# Patient Record
Sex: Female | Born: 1975 | Race: White | Hispanic: No | Marital: Married | Smoking: Never smoker
Health system: Southern US, Community
[De-identification: ages and names within clinical notes are randomized; demographics above are authoritative.]

## PROBLEM LIST (undated history)

## (undated) DIAGNOSIS — K589 Irritable bowel syndrome without diarrhea: Secondary | ICD-10-CM

## (undated) DIAGNOSIS — F32A Depression, unspecified: Secondary | ICD-10-CM

## (undated) DIAGNOSIS — F329 Major depressive disorder, single episode, unspecified: Secondary | ICD-10-CM

## (undated) HISTORY — PX: CHOLECYSTECTOMY: SHX55

---

## 2002-08-04 ENCOUNTER — Encounter: Admission: RE | Admit: 2002-08-04 | Discharge: 2002-08-04 | Payer: Self-pay | Admitting: Infectious Diseases

## 2012-11-08 ENCOUNTER — Emergency Department (HOSPITAL_BASED_OUTPATIENT_CLINIC_OR_DEPARTMENT_OTHER)
Admission: EM | Admit: 2012-11-08 | Discharge: 2012-11-08 | Disposition: A | Discharge status: Home / Self Care | Payer: BC Managed Care – PPO | Attending: Emergency Medicine | Admitting: Emergency Medicine

## 2012-11-08 ENCOUNTER — Encounter (HOSPITAL_BASED_OUTPATIENT_CLINIC_OR_DEPARTMENT_OTHER): Payer: Self-pay | Admitting: *Deleted

## 2012-11-08 DIAGNOSIS — R1031 Right lower quadrant pain: Secondary | ICD-10-CM | POA: Insufficient documentation

## 2012-11-08 DIAGNOSIS — Z3202 Encounter for pregnancy test, result negative: Secondary | ICD-10-CM | POA: Insufficient documentation

## 2012-11-08 DIAGNOSIS — F3289 Other specified depressive episodes: Secondary | ICD-10-CM | POA: Insufficient documentation

## 2012-11-08 DIAGNOSIS — R109 Unspecified abdominal pain: Secondary | ICD-10-CM

## 2012-11-08 DIAGNOSIS — Z79899 Other long term (current) drug therapy: Secondary | ICD-10-CM | POA: Insufficient documentation

## 2012-11-08 DIAGNOSIS — F329 Major depressive disorder, single episode, unspecified: Secondary | ICD-10-CM | POA: Insufficient documentation

## 2012-11-08 DIAGNOSIS — R112 Nausea with vomiting, unspecified: Secondary | ICD-10-CM | POA: Insufficient documentation

## 2012-11-08 DIAGNOSIS — Z8719 Personal history of other diseases of the digestive system: Secondary | ICD-10-CM | POA: Insufficient documentation

## 2012-11-08 HISTORY — DX: Irritable bowel syndrome, unspecified: K58.9

## 2012-11-08 HISTORY — DX: Major depressive disorder, single episode, unspecified: F32.9

## 2012-11-08 HISTORY — DX: Depression, unspecified: F32.A

## 2012-11-08 LAB — COMPREHENSIVE METABOLIC PANEL
ALT: 11 U/L (ref 0–35)
AST: 14 U/L (ref 0–37)
Albumin: 4.5 g/dL (ref 3.5–5.2)
Alkaline Phosphatase: 71 U/L (ref 39–117)
BUN: 12 mg/dL (ref 6–23)
Chloride: 99 mEq/L (ref 96–112)
Potassium: 3.7 mEq/L (ref 3.5–5.1)
Sodium: 136 mEq/L (ref 135–145)
Total Bilirubin: 0.3 mg/dL (ref 0.3–1.2)
Total Protein: 8.4 g/dL — ABNORMAL HIGH (ref 6.0–8.3)

## 2012-11-08 LAB — CBC WITH DIFFERENTIAL/PLATELET
Basophils Relative: 0 % (ref 0–1)
Eosinophils Absolute: 0.1 10*3/uL (ref 0.0–0.7)
Hemoglobin: 14.7 g/dL (ref 12.0–15.0)
MCHC: 34.8 g/dL (ref 30.0–36.0)
Monocytes Relative: 5 % (ref 3–12)
Neutro Abs: 9.6 10*3/uL — ABNORMAL HIGH (ref 1.7–7.7)
Neutrophils Relative %: 80 % — ABNORMAL HIGH (ref 43–77)
Platelets: 249 10*3/uL (ref 150–400)
RBC: 4.89 MIL/uL (ref 3.87–5.11)

## 2012-11-08 LAB — URINALYSIS, ROUTINE W REFLEX MICROSCOPIC
Glucose, UA: NEGATIVE mg/dL
Leukocytes, UA: NEGATIVE
Nitrite: NEGATIVE
Protein, ur: NEGATIVE mg/dL
pH: 8.5 — ABNORMAL HIGH (ref 5.0–8.0)

## 2012-11-08 LAB — LIPASE, BLOOD: Lipase: 16 U/L (ref 11–59)

## 2012-11-08 LAB — PREGNANCY, URINE: Preg Test, Ur: NEGATIVE

## 2012-11-08 MED ORDER — ONDANSETRON HCL 4 MG/2ML IJ SOLN
4.0000 mg | Freq: Once | INTRAMUSCULAR | Status: AC
  Administered 2012-11-08: 4 mg via INTRAVENOUS
  Filled 2012-11-08: qty 2

## 2012-11-08 MED ORDER — HYDROMORPHONE HCL PF 1 MG/ML IJ SOLN
1.0000 mg | Freq: Once | INTRAMUSCULAR | Status: AC
Start: 2012-11-08 — End: 2012-11-08
  Administered 2012-11-08: 1 mg via INTRAVENOUS
  Filled 2012-11-08: qty 1

## 2012-11-08 MED ORDER — PROMETHAZINE HCL 25 MG PO TABS: 25.0000 mg | ORAL_TABLET | Freq: Four times a day (QID) | ORAL | Status: DC | PRN

## 2012-11-08 MED ORDER — OXYCODONE-ACETAMINOPHEN 5-325 MG PO TABS: 1.0000 | ORAL_TABLET | Freq: Four times a day (QID) | ORAL | Status: DC | PRN

## 2012-11-08 NOTE — ED Notes (Signed)
Right lower quadrant abdominal pain. Has been treated for ovarian cyst this week. Yesterday had blood work and was started on Doxycycline. Vomiting this am. Her MD advised she be seen at the ED.

## 2012-11-08 NOTE — ED Notes (Signed)
Patient is resting comfortably. 

## 2012-11-08 NOTE — ED Notes (Signed)
Vital signs stable. 

## 2012-11-08 NOTE — ED Notes (Signed)
Family at bedside. 

## 2012-11-08 NOTE — ED Provider Notes (Signed)
History     CSN: [id number]  Arrival date & time 11/08/12  1145   First MD Initiated Contact with Patient 11/08/12 1526      Chief Complaint  Patient presents with  . Abdominal Pain    (Consider location/radiation/quality/duration/timing/severity/associated sxs/prior treatment) HPI Comments: Patient with history of ovarian cysts presents with a 1 week history of RLQ pain. She was seen at urgent 5 days ago where a CT showed a right ovarian cyst.  She followed up with her OB/GYN who confirmed the ovarian cyst without torsion or other complications using ultrasound.  Her LMP was 3 weeks ago and pregnancy tests have been negative. She denies vaginal discharge or bleeding.  Yesterday, the pain was not better so she saw her PCP who did a CBC which showed an elevated white count and he prescribed doxycycline for an intestinal infection.  She presents today with continued RLQ pain.  She vomited this morning and has been unable to eat since.  She denies fever, constipation, and diarrhea. Onset gradual. Course persistent. She has been taking Vicodin for pain that provides some relief.   Patient is a 37 y.o. female presenting with abdominal pain. The history is provided by the patient.  Abdominal Pain The primary symptoms of the illness include abdominal pain, nausea and vomiting. The primary symptoms of the illness do not include fever, diarrhea, dysuria, vaginal discharge or vaginal bleeding.  Symptoms associated with the illness do not include hematuria or frequency.    Past Medical History  Diagnosis Date  . IBS (irritable bowel syndrome)   . Depression     Past Surgical History  Procedure Date  . Cholecystectomy     No family history on file.  History  Substance Use Topics  . Smoking status: Never Smoker   . Smokeless tobacco: Not on file  . Alcohol Use: No    OB History    Grav Para Term Preterm Abortions TAB SAB Ect Mult Living                  Review of Systems    Constitutional: Negative for fever.  HENT: Negative for sore throat and rhinorrhea.   Eyes: Negative for redness.  Respiratory: Negative for cough.   Cardiovascular: Negative for chest pain.  Gastrointestinal: Positive for nausea, vomiting and abdominal pain. Negative for diarrhea.  Genitourinary: Negative for dysuria, frequency, hematuria, vaginal bleeding, vaginal discharge and pelvic pain.  Musculoskeletal: Negative for myalgias.  Skin: Negative for rash.  Neurological: Negative for headaches.    Allergies  Compazine; Sulfa antibiotics; and Penicillins  Home Medications   Current Outpatient Rx  Name  Route  Sig  Dispense  Refill  . SERTRALINE HCL 100 MG PO TABS   Oral   Take 100 mg by mouth daily.           BP 134/108  Pulse 96  Temp 98.4 F (36.9 C) (Oral)  Resp 20  SpO2 100%  Physical Exam  Nursing note and vitals reviewed. Constitutional: She appears well-developed and well-nourished.  HENT:  Head: Normocephalic and atraumatic.  Eyes: Conjunctivae normal are normal. Right eye exhibits no discharge. Left eye exhibits no discharge.  Neck: Normal range of motion. Neck supple.  Cardiovascular: Normal rate, regular rhythm and normal heart sounds.   Pulmonary/Chest: Effort normal and breath sounds normal.  Abdominal: Soft. Bowel sounds are normal. There is tenderness (mild) in the right lower quadrant. There is no rigidity, no rebound, no guarding, no tenderness at McBurney's point  and negative Murphy's sign.         Neg psoas and neg rovsings  Neurological: She is alert.  Skin: Skin is warm and dry.  Psychiatric: She has a normal mood and affect.    ED Course  Procedures (including critical care time)  Labs Reviewed  URINALYSIS, ROUTINE W REFLEX MICROSCOPIC - Abnormal; Notable for the following:    APPearance CLOUDY (*)     pH 8.5 (*)     All other components within normal limits  CBC WITH DIFFERENTIAL - Abnormal; Notable for the following:    WBC 12.0  (*)     Neutrophils Relative 80 (*)     Neutro Abs 9.6 (*)     All other components within normal limits  COMPREHENSIVE METABOLIC PANEL - Abnormal; Notable for the following:    Total Protein 8.4 (*)     All other components within normal limits  PREGNANCY, URINE  LIPASE, BLOOD   No results found.   1. Abdominal pain     Patient seen and examined. Work-up initiated. Medications ordered.   Vital signs reviewed and are as follows: Filed Vitals:   11/08/12 1759  BP: 131/82  Pulse: 71  Temp:   Resp: 15   CT report obtained from 11/03/12. Pt had 3.7cm benign R ovarian cyst. No other significant findings.   Results reviewed with Dr. Blinda Leatherwood and patient.   At this point we will continue symptomatic control -- given unchanged symptoms.   The patient was urged to return to the Emergency Department immediately with worsening of current symptoms, worsening abdominal pain, persistent vomiting, blood noted in stools, fever, or any other concerns. The patient verbalized understanding.   Patient counseled on use of narcotic pain medications. Counseled not to combine these medications with others containing tylenol. Urged not to drink alcohol, drive, or perform any other activities that requires focus while taking these medications. The patient verbalizes understanding and agrees with the plan.     MDM  Patient with right lower quadrant abdominal pain for the past week. She has had persistent pain that has not changed. She has not had fever. She had CT as well as ultrasound which demonstrated 3.7 cm ovarian cyst. She has mild leukocytosis. Nausea improved and tolerating POs in ED. Exam with minimal tenderness. No peritoneal signs. Do not feel patient warrants an additional CT scan tonight. She is reliable and is okay with watchful waiting. Given duration of symptoms as well as exam here feel that appendicitis is very low probability. Given exam, feel that right ovarian cyst is likely the  etiology of her symptoms. Do not suspect colitis or diverticulitis. Do not suspect ovarian torsion, TOA, PID. Patient appears well and non-toxic.         Renne Crigler, [place] 11/08/12 [phone]

## 2012-11-08 NOTE — ED Provider Notes (Signed)
Medical screening examination/treatment/procedure(s) were performed by non-physician practitioner and as supervising physician I was immediately available for consultation/collaboration.  Gilda Crease, MD 11/08/12 [phone]

## 2017-03-08 ENCOUNTER — Encounter: Payer: Self-pay | Admitting: Sports Medicine

## 2017-03-08 ENCOUNTER — Ambulatory Visit (INDEPENDENT_AMBULATORY_CARE_PROVIDER_SITE_OTHER): Payer: 59 | Admitting: Sports Medicine

## 2017-03-08 DIAGNOSIS — M25572 Pain in left ankle and joints of left foot: Secondary | ICD-10-CM

## 2017-03-08 DIAGNOSIS — M84375D Stress fracture, left foot, subsequent encounter for fracture with routine healing: Secondary | ICD-10-CM | POA: Diagnosis not present

## 2017-03-08 DIAGNOSIS — M84375A Stress fracture, left foot, initial encounter for fracture: Secondary | ICD-10-CM | POA: Insufficient documentation

## 2017-03-08 NOTE — Progress Notes (Signed)
Left foot pain  8 weeks ago dx w left 4th MT stress fx Occurred after "boot camp" Put in boot and pain steadily resolved Saw orthopedist 2 weeks ago and had XR Advised not completely healed    Past 2 weeks has walked and tried some running  Still has some pain but no swelling Comes for opinion  ROS No numbness in feet No pain in RT foot  PE Pleasant F in NAD BP (!) 150/112   Ht 5\' 4"  (1.626 m)   Wt 200 lb (90.7 kg)   BMI 34.33 kg/m   Left Foot shows subluxation of left 5th MTP with lateral rotation Loss of transverse arch Splaying toes 1 and 2 No TTP over MT 4 No swelling   RT foot shows more normal transverse arch Long arch is moderately high  Gait is normal  Ultrasound of left Foot  MTP joint 4 is normal 4th MT shaft is normal on long and short axis Other MT shafts scanned and appear normal No soft tissue swelling  Impression: healed stress fracture based on normal scan today

## 2017-03-08 NOTE — Assessment & Plan Note (Signed)
Placed an arch strap MT pad for left foot  Both of these changes seemed to relieve pain Test for 1 week If working well add MT pads to other shoes  Gradually increase activity as tolerated

## 2019-08-25 ENCOUNTER — Other Ambulatory Visit: Payer: Self-pay

## 2019-08-25 ENCOUNTER — Ambulatory Visit
Admission: RE | Admit: 2019-08-25 | Discharge: 2019-08-25 | Disposition: A | Discharge status: Home / Self Care | Payer: 59 | Source: Ambulatory Visit | Attending: Family Medicine | Admitting: Family Medicine

## 2019-08-25 ENCOUNTER — Ambulatory Visit: Payer: 59 | Admitting: Family Medicine

## 2019-08-25 ENCOUNTER — Encounter: Payer: Self-pay | Admitting: Family Medicine

## 2019-08-25 VITALS — BP 122/90 | Ht 64.0 in | Wt 220.0 lb

## 2019-08-25 DIAGNOSIS — M25532 Pain in left wrist: Secondary | ICD-10-CM

## 2019-08-25 NOTE — Assessment & Plan Note (Addendum)
Left hand and wrist pain status post fall.  Focal tenderness over left metacarpal bone.  Ultrasound not show any gross fracture.  Sent for plain film evaluation of left hand to rule out fracture and wrist to rule out scaphoid fracture.  X-rays were negative for fracture.  Recommend patient wear brace for comfort, ice or NSAIDs for pain relief, follow-up in 3 weeks if not improved.

## 2019-08-25 NOTE — Patient Instructions (Signed)
Your x-rays look great. I'd expect this to take 2-3 weeks to resolve. Icing 15 minutes at a time 3-4 times a day as needed. Tylenol, aleve if needed for pain. You can use the brace or buddy taping if needed for comfort. Follow up with me in 3 weeks if not improving as expected.

## 2019-08-25 NOTE — Progress Notes (Addendum)
    Subjective:  Rachel Cohen is a 43 y.o. female who presents to the [facility] today with a chief complaint of left hand pain.   HPI:  Patient fell on left hand on Thursday after tripping over her pants.  Patient has had pain in her left hand since then dorsally on radial side.  Has been worsening.  Says the pain is 7 or 8 out of 10.  Says that it does radiate up her hand into her shoulder.  Does endorse some finger numbness.  She has been taking ibuprofen and icing the area as needed for relief.  She is also worn a wrist brace which has not provided any relief.  ROS: Per HPI  Objective:  Physical Exam: BP 122/90   Ht 5\' 4"  (1.626 m)   Wt 220 lb (99.8 kg)   BMI 37.76 kg/m   Gen: NAD, resting comfortably Pulm: NWOB  Left hand Inspection: Some edema and contusion over dorsal aspect of hand especially over 2nd metacarpal. Palpation: Tender to palpation over the dorsal aspect of the second metacarpal, minimal tenderness over snuffbox ROM: Full active range of motion of fingers  Strength: Weak grip strength due to pain Stability: Wrist appears stable Special tests: None Vascular studies: 2+ radial Skin: warm, dry Psych: Normal affect and thought content  Right hand: No deformity, instability. FROM with 5/5 strength. No tenderness to palpation. NVI distally.  POC ultrasound did not show any fracture of the second metacarpal, did show some minimal tenosynovitis of extensor carpi radialis brevis, remainder of exam is normal  Assessment/Plan:  Left wrist pain Left hand and wrist pain status post fall.  Focal tenderness over left second metacarpal bone.  Ultrasound did not show any gross fracture.  Sent for plain film evaluation of left hand to rule out fracture and wrist to rule out scaphoid fracture.  X-rays independently reviewed were negative for fracture.  Recommend patient wear brace for comfort, ice or NSAIDs for pain relief, follow-up in 3 weeks if not improved.    Marny Lowenstein, MD, MS FAMILY MEDICINE RESIDENT - PGY3 08/25/2019 4:07 PM

## 2019-08-26 ENCOUNTER — Encounter: Payer: Self-pay | Admitting: Family Medicine

## 2019-09-24 ENCOUNTER — Ambulatory Visit: Payer: 59 | Admitting: Family Medicine

## 2019-09-24 ENCOUNTER — Ambulatory Visit
Admission: RE | Admit: 2019-09-24 | Discharge: 2019-09-24 | Disposition: A | Discharge status: Home / Self Care | Payer: 59 | Source: Ambulatory Visit | Attending: Family Medicine | Admitting: Family Medicine

## 2019-09-24 ENCOUNTER — Other Ambulatory Visit: Payer: Self-pay

## 2019-09-24 VITALS — BP 134/92 | Ht 64.0 in | Wt 220.0 lb

## 2019-09-24 DIAGNOSIS — M79642 Pain in left hand: Secondary | ICD-10-CM | POA: Diagnosis not present

## 2019-09-24 NOTE — Patient Instructions (Signed)
Get x-rays after you leave today. We will call you with results and next steps (likely MRI if these are normal). Icing, tylenol, aleve as needed in meantime.

## 2019-09-25 ENCOUNTER — Encounter: Payer: Self-pay | Admitting: Family Medicine

## 2019-09-25 ENCOUNTER — Telehealth: Payer: Self-pay

## 2019-09-25 NOTE — Progress Notes (Signed)
PCP: Maylon Peppers, MD  Subjective:   HPI: Patient is a 43 y.o. female here for left hand pain.  11/2: Patient fell on left hand on Thursday after tripping over her pants.  Patient has had pain in her left hand since then dorsally on radial side.  Has been worsening.  Says the pain is 7 or 8 out of 10.  Says that it does radiate up her hand into her shoulder.  Does endorse some finger numbness.  She has been taking ibuprofen and icing the area as needed for relief.  She is also worn a wrist brace which has not provided any relief.  12/2: Patient reports her hand feels the same compared to last visit. She wore brace, buddy taped digits initially. Still getting swelling, discoloration, and pain localized dorsally about the 2nd metacarpal. No new injuries.  Past Medical History:  Diagnosis Date  . Depression   . IBS (irritable bowel syndrome)     Current Outpatient Medications on File Prior to Visit  Medication Sig Dispense Refill  . ALPRAZolam (XANAX) 0.25 MG tablet TK 1 T PO HS PRN    . hydrOXYzine (ATARAX/VISTARIL) 25 MG tablet TK 2 TS PO HS    . levonorgestrel-ethinyl estradiol (INTROVALE) 0.15-0.03 MG tablet Take by mouth.    . pantoprazole (PROTONIX) 40 MG tablet Take by mouth.    . sertraline (ZOLOFT) 100 MG tablet Take 100 mg by mouth daily.     No current facility-administered medications on file prior to visit.     Past Surgical History:  Procedure Laterality Date  . CHOLECYSTECTOMY      Allergies  Allergen Reactions  . Compazine [Prochlorperazine] Nausea Only  . Sulfa Antibiotics Nausea Only  . Penicillins Rash    Social History   Socioeconomic History  . Marital status: Married    Spouse name: Not on file  . Number of children: Not on file  . Years of education: Not on file  . Highest education level: Not on file  Occupational History  . Not on file  Social Needs  . Financial resource strain: Not on file  . Food insecurity    Worry: Not on file   Inability: Not on file  . Transportation needs    Medical: Not on file    Non-medical: Not on file  Tobacco Use  . Smoking status: Never Smoker  . Smokeless tobacco: Never Used  Substance and Sexual Activity  . Alcohol use: No  . Drug use: No  . Sexual activity: Not on file  Lifestyle  . Physical activity    Days per week: Not on file    Minutes per session: Not on file  . Stress: Not on file  Relationships  . Social Herbalist on phone: Not on file    Gets together: Not on file    Attends religious service: Not on file    Active member of club or organization: Not on file    Attends meetings of clubs or organizations: Not on file    Relationship status: Not on file  . Intimate partner violence    Fear of current or ex partner: Not on file    Emotionally abused: Not on file    Physically abused: Not on file    Forced sexual activity: Not on file  Other Topics Concern  . Not on file  Social History Narrative  . Not on file    History reviewed. No pertinent family history.  BP (!) 134/92   Ht 5\' 4"  (1.626 m)   Wt 220 lb (99.8 kg)   BMI 37.76 kg/m   Review of Systems: See HPI above.     Objective:  Physical Exam:  Gen: NAD, comfortable in exam room  Left hand/wrist: Mild swelling dorsal left hand worst over 2nd metacarpal.  No bruising, malrotation, angulation of digits. FROM with 5/5 strength digits including PIP, DIP flexion and extension 2nd digit. TTP greatest dorsal 2nd metacarpal.  Minimal tenderness 3rd metacarpal, radial sided carpal bones.   NVI distally.  Limited MSK u/s left hand:  No cortical irregularity noted of 2nd, 3rd metacarpals or scaphoid.  No edema overlying cortices.     Assessment & Plan:  1. Left hand injury - patient over a month out from her fall and continues to struggle with pain, swelling worst about 2nd metacarpal.  MSK u/s without abnormalities today and is reassuring.  Advised we go ahead with radiographs and depending  on results consider MRI to assess for occult fracture, partial extensor tendon tear.  Icing, tylenol, aleve in meantime.

## 2019-09-25 NOTE — Telephone Encounter (Signed)
Let pt know that her x-ray was negative. Dr. Barbaraann Barthel suggested proceeding with the MRI to rule out occult fracture. Pt wants to wait on the MRI because insurance won't cover much and they are expensive. She was asking if she could treat it conservatively as if there was a fracture and see if she gets better. Informed pt that Dr. Barbaraann Barthel will be back in the office tomorrow morning so her message would be passed along to him and we'd get back to her.

## 2019-09-26 ENCOUNTER — Other Ambulatory Visit: Payer: Self-pay

## 2019-09-26 ENCOUNTER — Ambulatory Visit (INDEPENDENT_AMBULATORY_CARE_PROVIDER_SITE_OTHER): Payer: 59 | Admitting: Family Medicine

## 2019-09-26 VITALS — BP 124/86

## 2019-09-26 DIAGNOSIS — M79642 Pain in left hand: Secondary | ICD-10-CM | POA: Diagnosis not present

## 2019-09-26 NOTE — Progress Notes (Signed)
Patient came in today to have radial gutter splint placed (see phone note).  Follow up in about 3 weeks.

## 2019-09-26 NOTE — Telephone Encounter (Signed)
We could - there are a couple options here.  One would be to put her in a radial gutter splint for 3 weeks to stabilize and rest this (we talked a little about this).    The more risky option would be to have her focus on regaining motion and strength in this hand - if she has an occult fracture it should be mostly healed by now and a lot of her pain will be due to stiffness and swelling (After 3 weeks of splinting we would do this if she chose the splint).

## 2019-10-20 ENCOUNTER — Other Ambulatory Visit: Payer: Self-pay

## 2019-10-20 ENCOUNTER — Ambulatory Visit: Payer: 59 | Admitting: Family Medicine

## 2019-10-20 ENCOUNTER — Encounter: Payer: Self-pay | Admitting: Family Medicine

## 2019-10-20 VITALS — BP 126/90

## 2019-10-20 DIAGNOSIS — M79642 Pain in left hand: Secondary | ICD-10-CM

## 2019-10-20 NOTE — Progress Notes (Signed)
PCP: Cheral Bay, MD  Subjective:   HPI: Patient is a 43 y.o. female here for left hand pain.  11/2: Patient fell on left hand on Thursday after tripping over her pants.  Patient has had pain in her left hand since then dorsally on radial side.  Has been worsening.  Says the pain is 7 or 8 out of 10.  Says that it does radiate up her hand into her shoulder.  Does endorse some finger numbness.  She has been taking ibuprofen and icing the area as needed for relief.  She is also worn a wrist brace which has not provided any relief.  12/2: Patient reports her hand feels the same compared to last visit. She wore brace, buddy taped digits initially. Still getting swelling, discoloration, and pain localized dorsally about the 2nd metacarpal. No new injuries.  12/28: Patient reports she feels much better  Has been wearing radial gutter splint without any issues. No pain currently. No skin changes, numbness.  Past Medical History:  Diagnosis Date  . Depression   . IBS (irritable bowel syndrome)     Current Outpatient Medications on File Prior to Visit  Medication Sig Dispense Refill  . albuterol (VENTOLIN HFA) 108 (90 Base) MCG/ACT inhaler Inhale into the lungs.    . beclomethasone (QVAR REDIHALER) 40 MCG/ACT inhaler Inhale into the lungs.    . doxepin (SINEQUAN) 10 MG capsule Take by mouth.    . ALPRAZolam (XANAX) 0.25 MG tablet TK 1 T PO HS PRN    . BuPROPion HBr 348 MG TB24 Take by mouth.    . hydrOXYzine (ATARAX/VISTARIL) 25 MG tablet TK 2 TS PO HS    . levonorgestrel-ethinyl estradiol (INTROVALE) 0.15-0.03 MG tablet Take by mouth.    . pantoprazole (PROTONIX) 40 MG tablet Take by mouth.    . sertraline (ZOLOFT) 100 MG tablet Take 100 mg by mouth daily.     No current facility-administered medications on file prior to visit.    Past Surgical History:  Procedure Laterality Date  . CHOLECYSTECTOMY      Allergies  Allergen Reactions  . Compazine [Prochlorperazine] Nausea  Only  . Sulfa Antibiotics Nausea Only  . Penicillins Rash    Social History   Socioeconomic History  . Marital status: Married    Spouse name: Not on file  . Number of children: Not on file  . Years of education: Not on file  . Highest education level: Not on file  Occupational History  . Not on file  Tobacco Use  . Smoking status: Never Smoker  . Smokeless tobacco: Never Used  Substance and Sexual Activity  . Alcohol use: No  . Drug use: No  . Sexual activity: Not on file  Other Topics Concern  . Not on file  Social History Narrative  . Not on file   Social Determinants of Health   Financial Resource Strain:   . Difficulty of Paying Living Expenses: Not on file  Food Insecurity:   . Worried About Programme researcher, broadcasting/film/video in the Last Year: Not on file  . Ran Out of Food in the Last Year: Not on file  Transportation Needs:   . Lack of Transportation (Medical): Not on file  . Lack of Transportation (Non-Medical): Not on file  Physical Activity:   . Days of Exercise per Week: Not on file  . Minutes of Exercise per Session: Not on file  Stress:   . Feeling of Stress : Not on file  Social Connections:   . Frequency of Communication with Friends and Family: Not on file  . Frequency of Social Gatherings with Friends and Family: Not on file  . Attends Religious Services: Not on file  . Active Member of Clubs or Organizations: Not on file  . Attends Archivist Meetings: Not on file  . Marital Status: Not on file  Intimate Partner Violence:   . Fear of Current or Ex-Partner: Not on file  . Emotionally Abused: Not on file  . Physically Abused: Not on file  . Sexually Abused: Not on file    History reviewed. No pertinent family history.  BP 126/90   Review of Systems: See HPI above.     Objective:  Physical Exam:  Gen: NAD, comfortable in exam room  Left hand/wrist: No deformity, swelling, bruising, instability. FROM with 5/5 strength digits and  wrist. No tenderness to palpation. NVI distally.     Assessment & Plan:  1. Left hand injury - consistent with metacarpal contusion vs healed occult metacarpal fracture.  Much improved today following gutter splint.  Discontinue this.  Icing, tylenol, aleve only if needed.  Motion exercises reviewed to work out stiffness.  Let us know if she doesn't continue to do well over next couple weeks.  Activities as tolerated.  Otherwise f/u prn.

## 2020-09-24 ENCOUNTER — Ambulatory Visit: Payer: 59 | Admitting: Family Medicine

## 2020-09-27 ENCOUNTER — Ambulatory Visit: Payer: 59 | Admitting: Family Medicine

## 2021-02-07 IMAGING — CR DG WRIST COMPLETE 3+V*L*
4 series · 4 of 4 positions shown · non-contrast
Comparison: None

CLINICAL DATA: Left hand and wrist pain

EXAM:
LEFT WRIST - COMPLETE 3+ VIEW

[x wrist pa left]
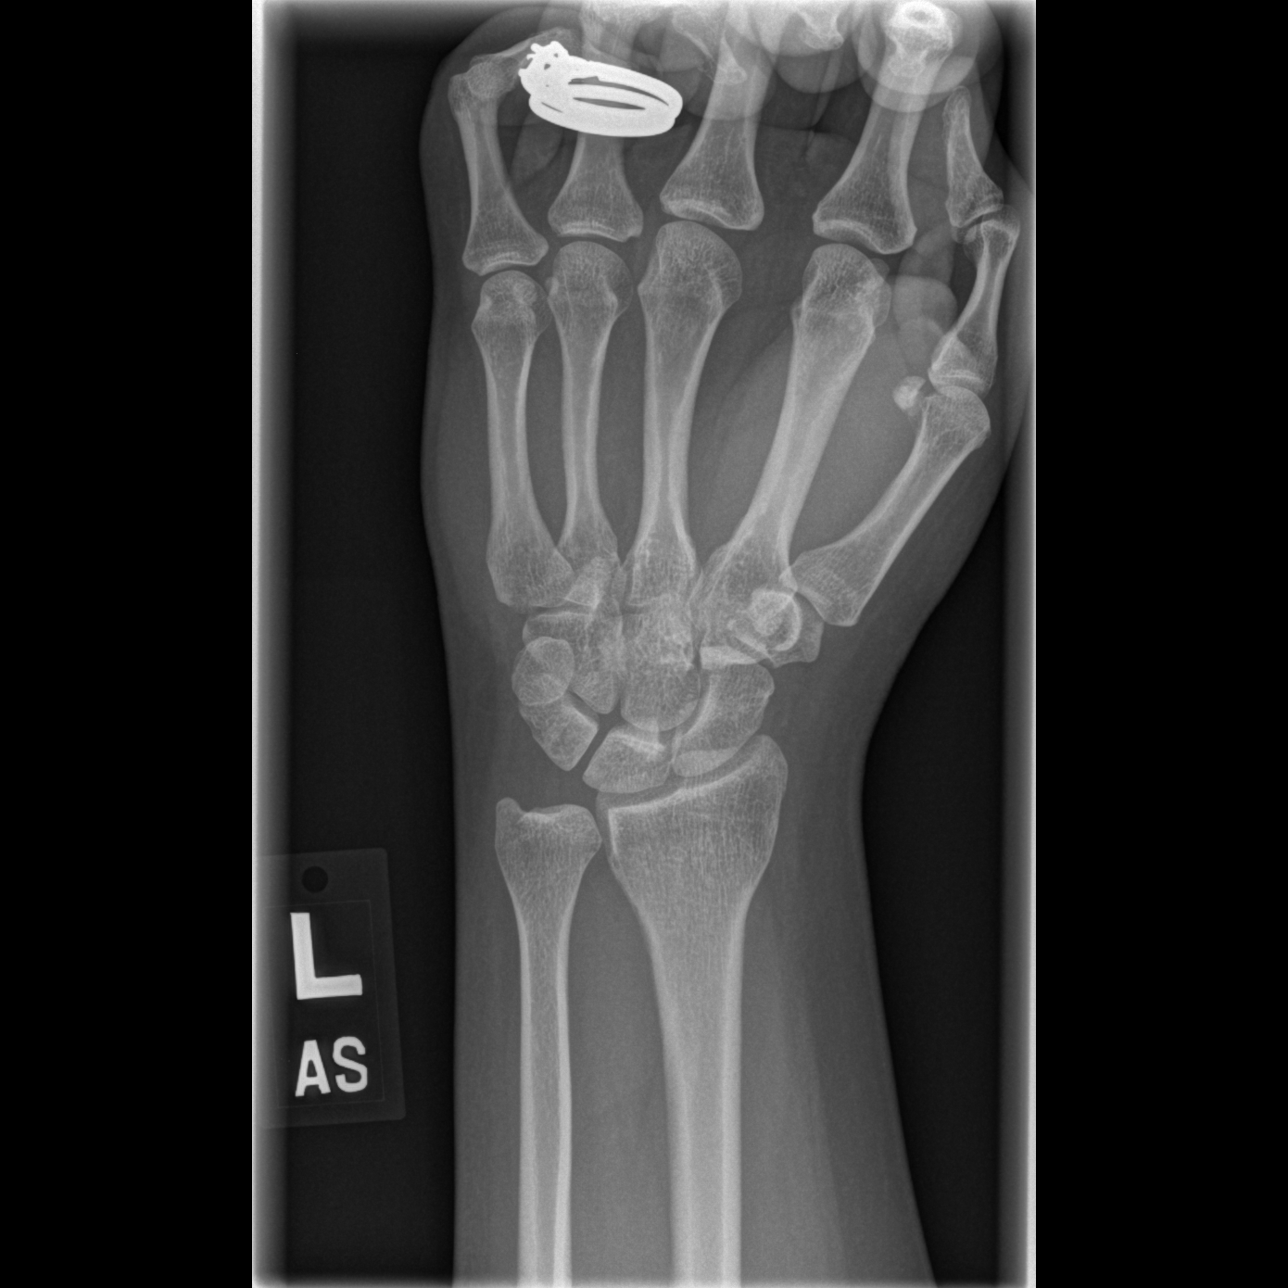

[x wrist obl left]
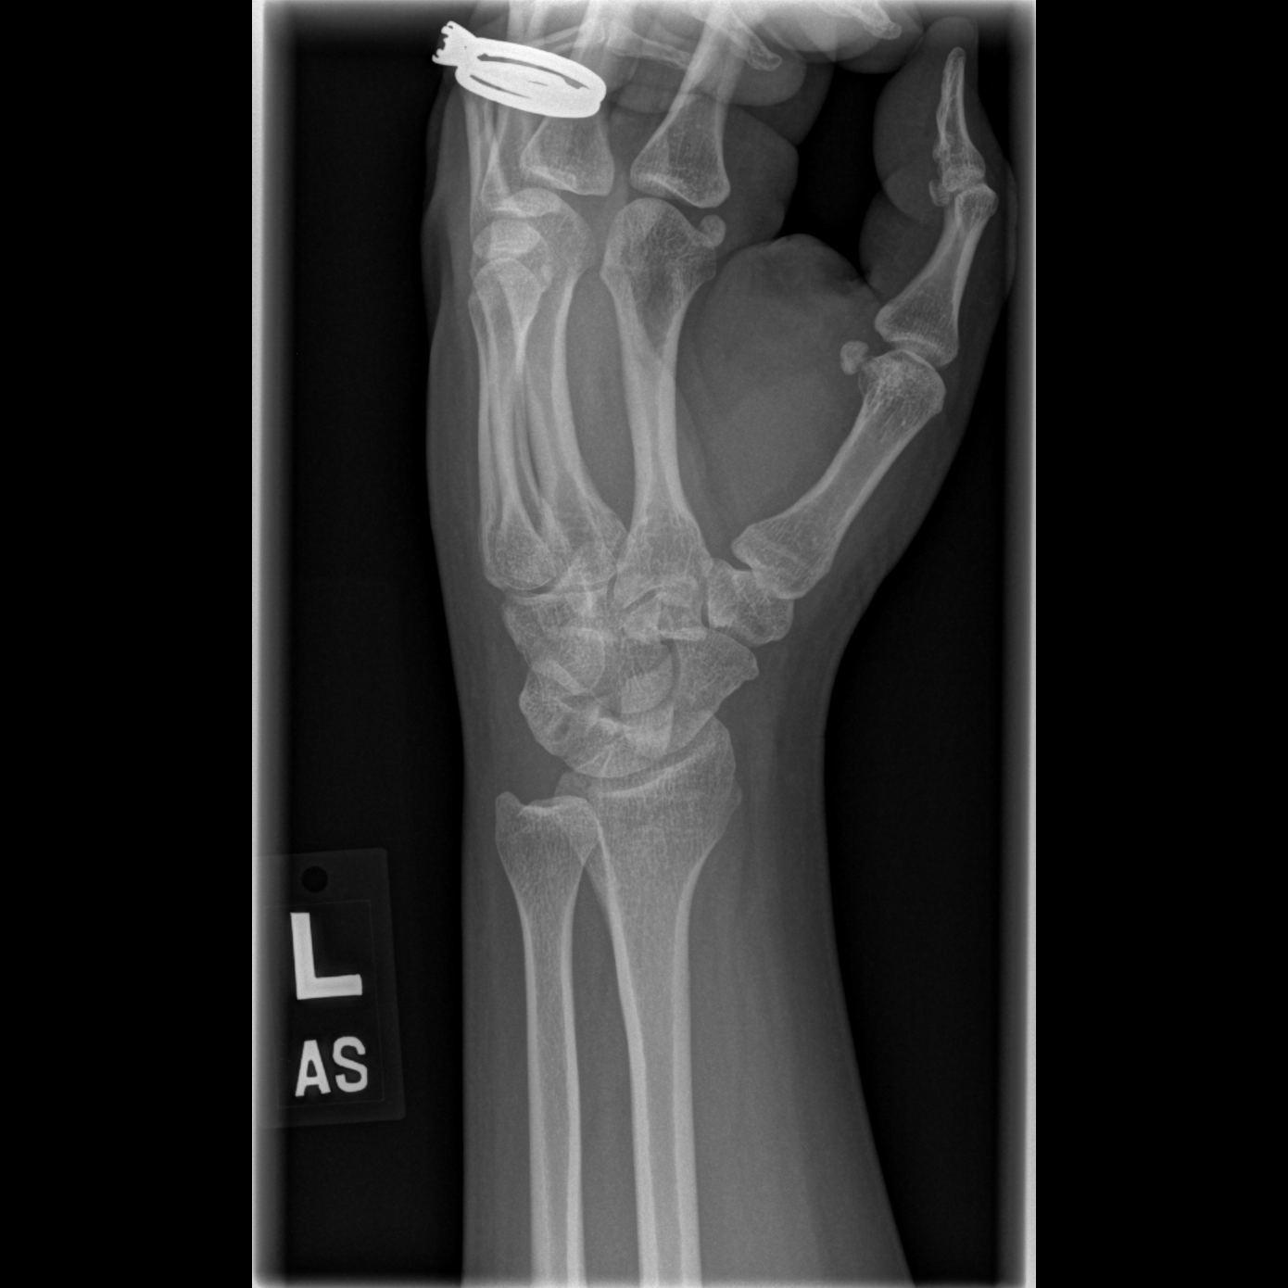

[x wrist lat left]
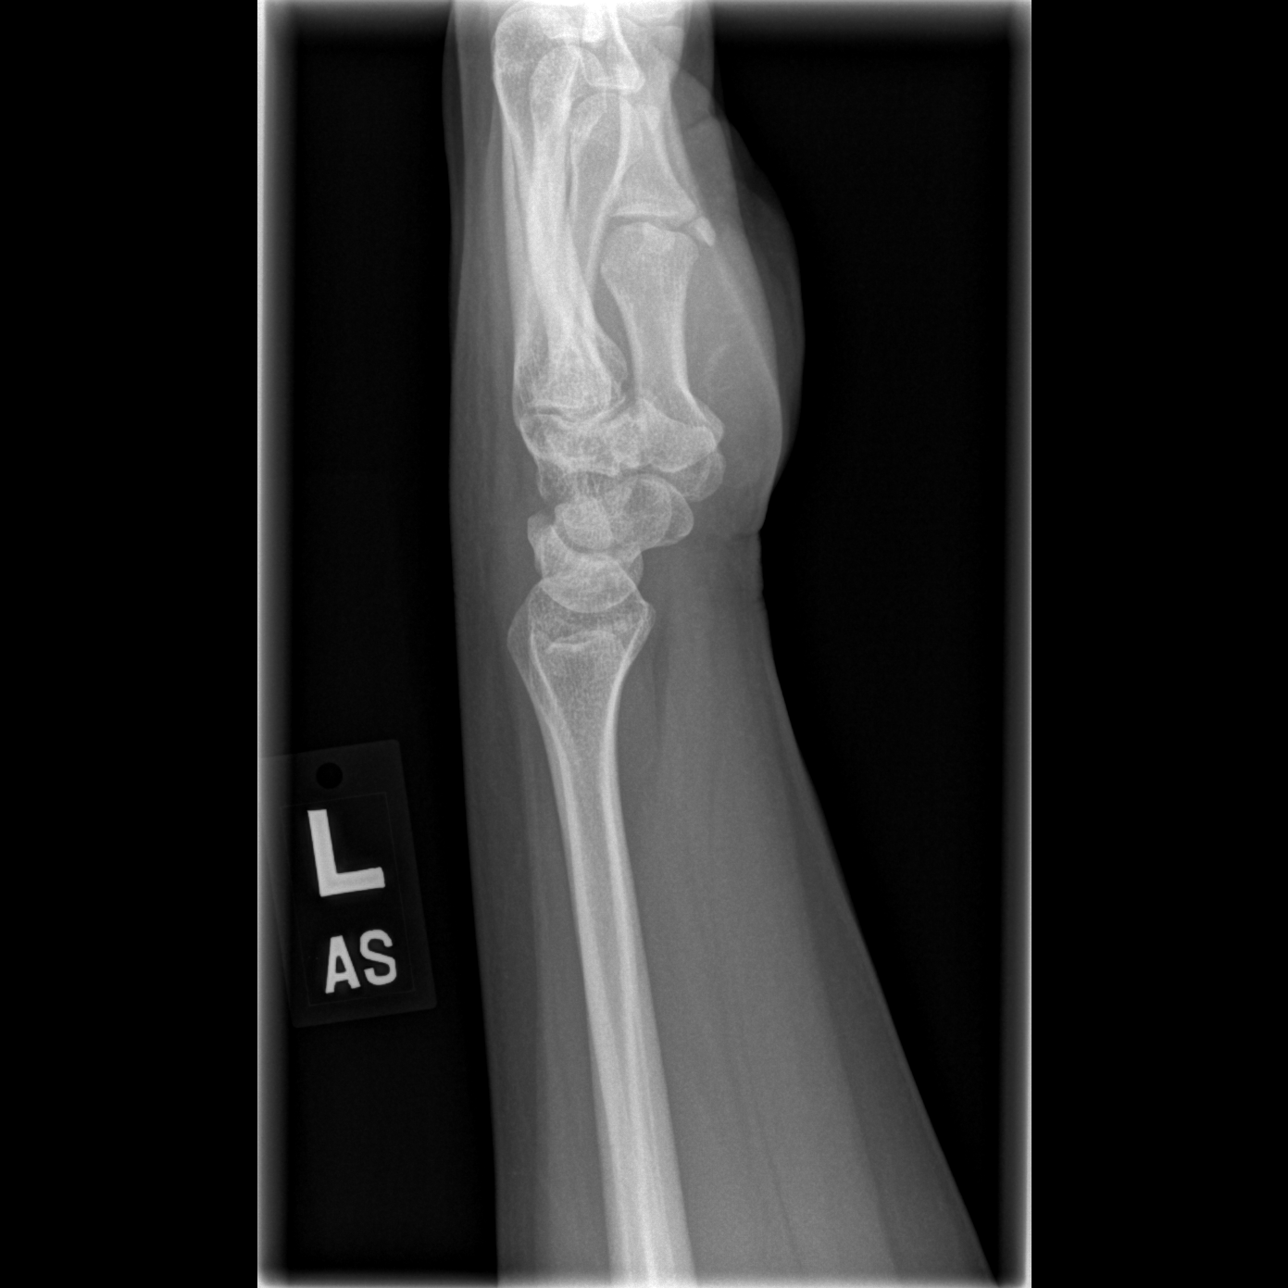

[x navicular]
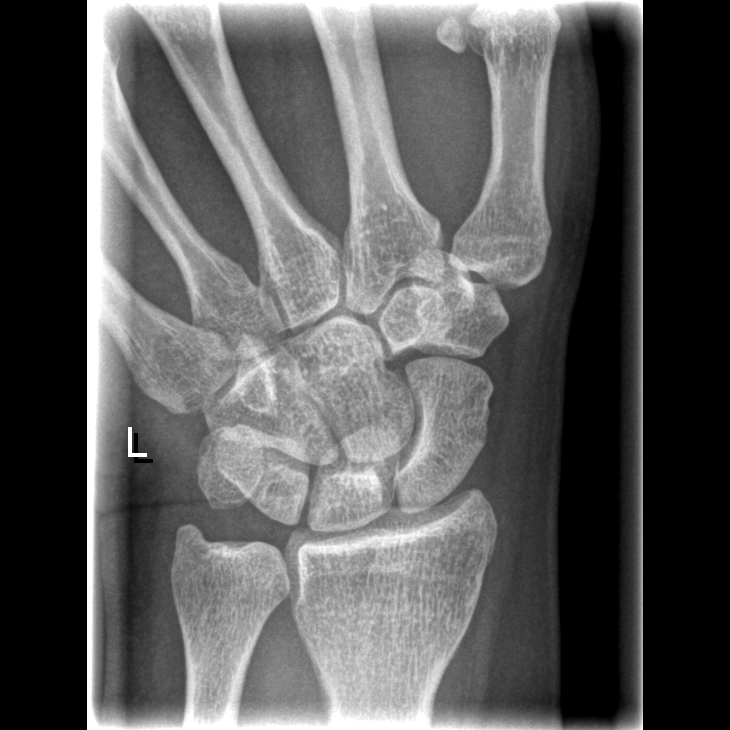

[4 of 4 positions shown; findings below may reference images not displayed]

FINDINGS: Osseous mineralization normal.

Joint spaces preserved.

No acute fracture, dislocation, or bone destruction.
IMPRESSION: Normal exam.

## 2021-03-09 IMAGING — CR DG HAND COMPLETE 3+V*L*
3 series · 3 of 3 positions shown · non-contrast
Comparison: 08/25/2019

CLINICAL DATA: Pain at the second metacarpal

EXAM:
LEFT HAND - COMPLETE 3+ VIEW

[x hand pa left]
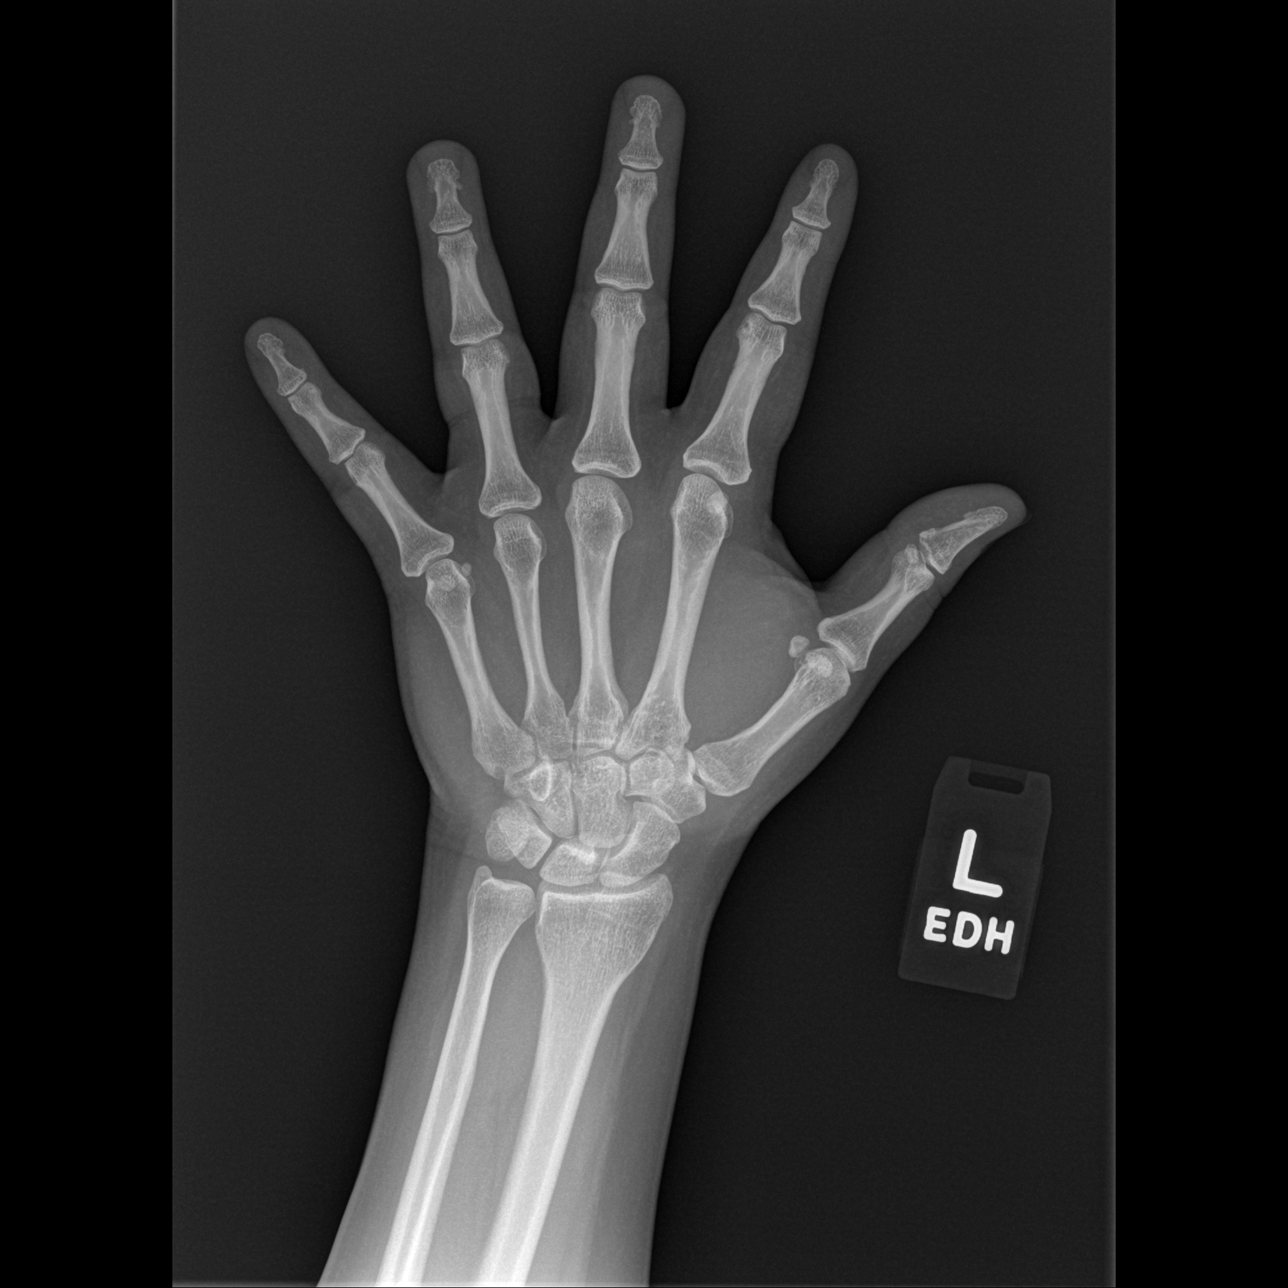

[x hand oblique left]
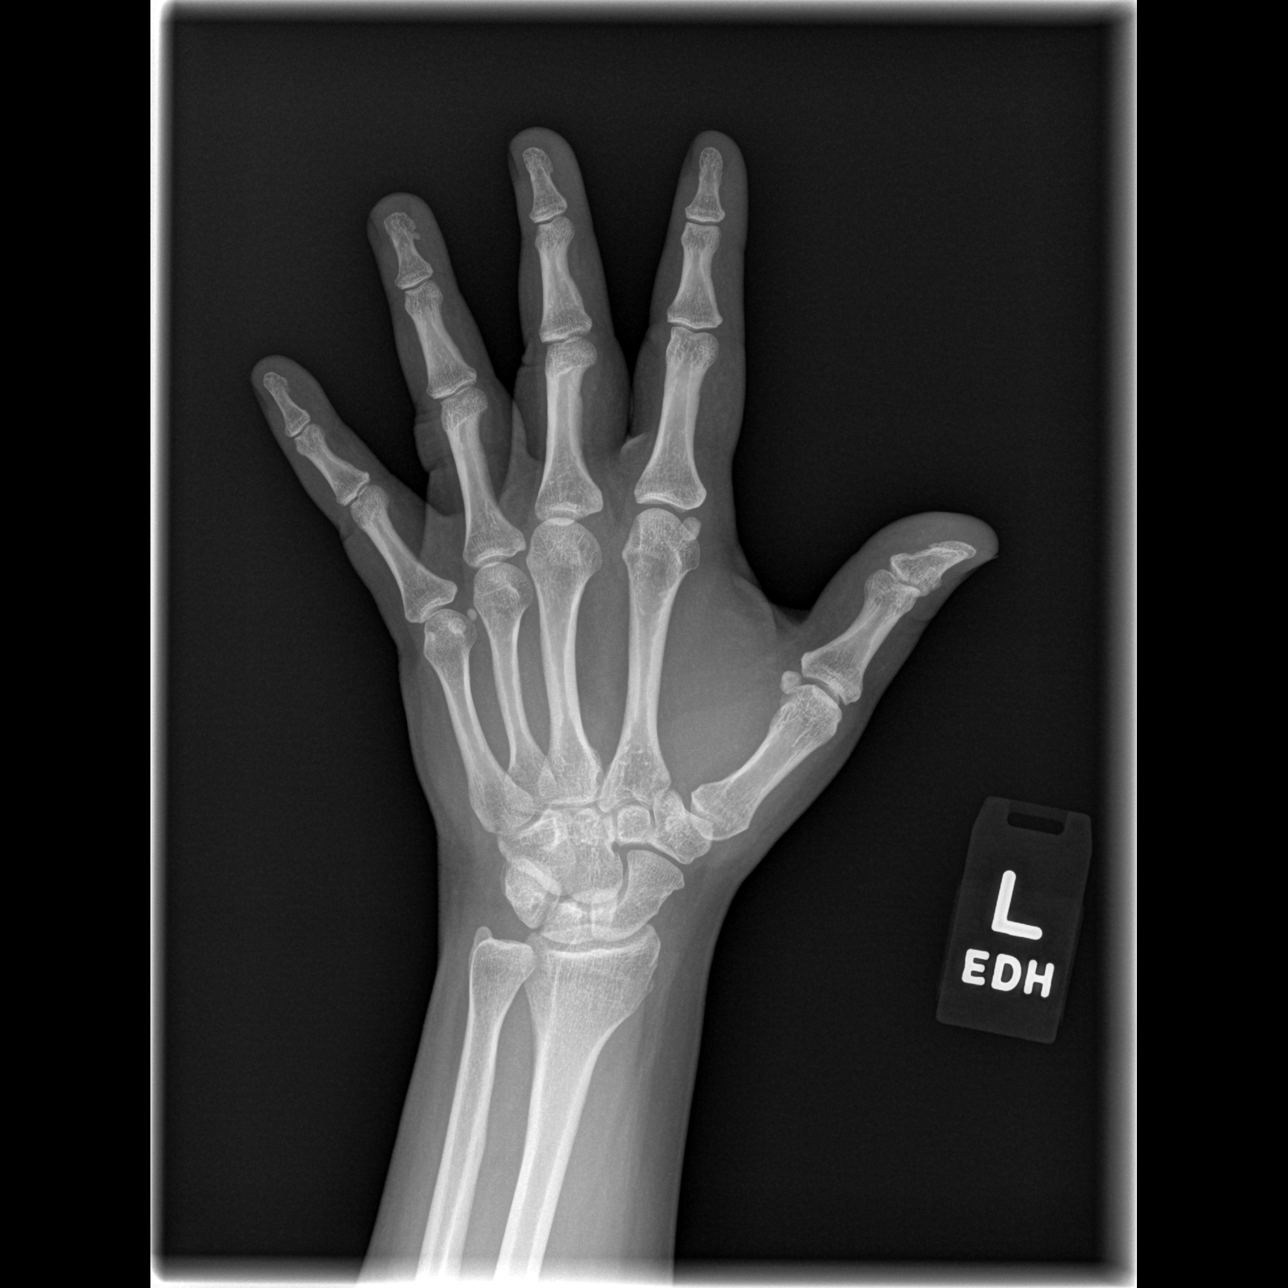

[x hand lat left]
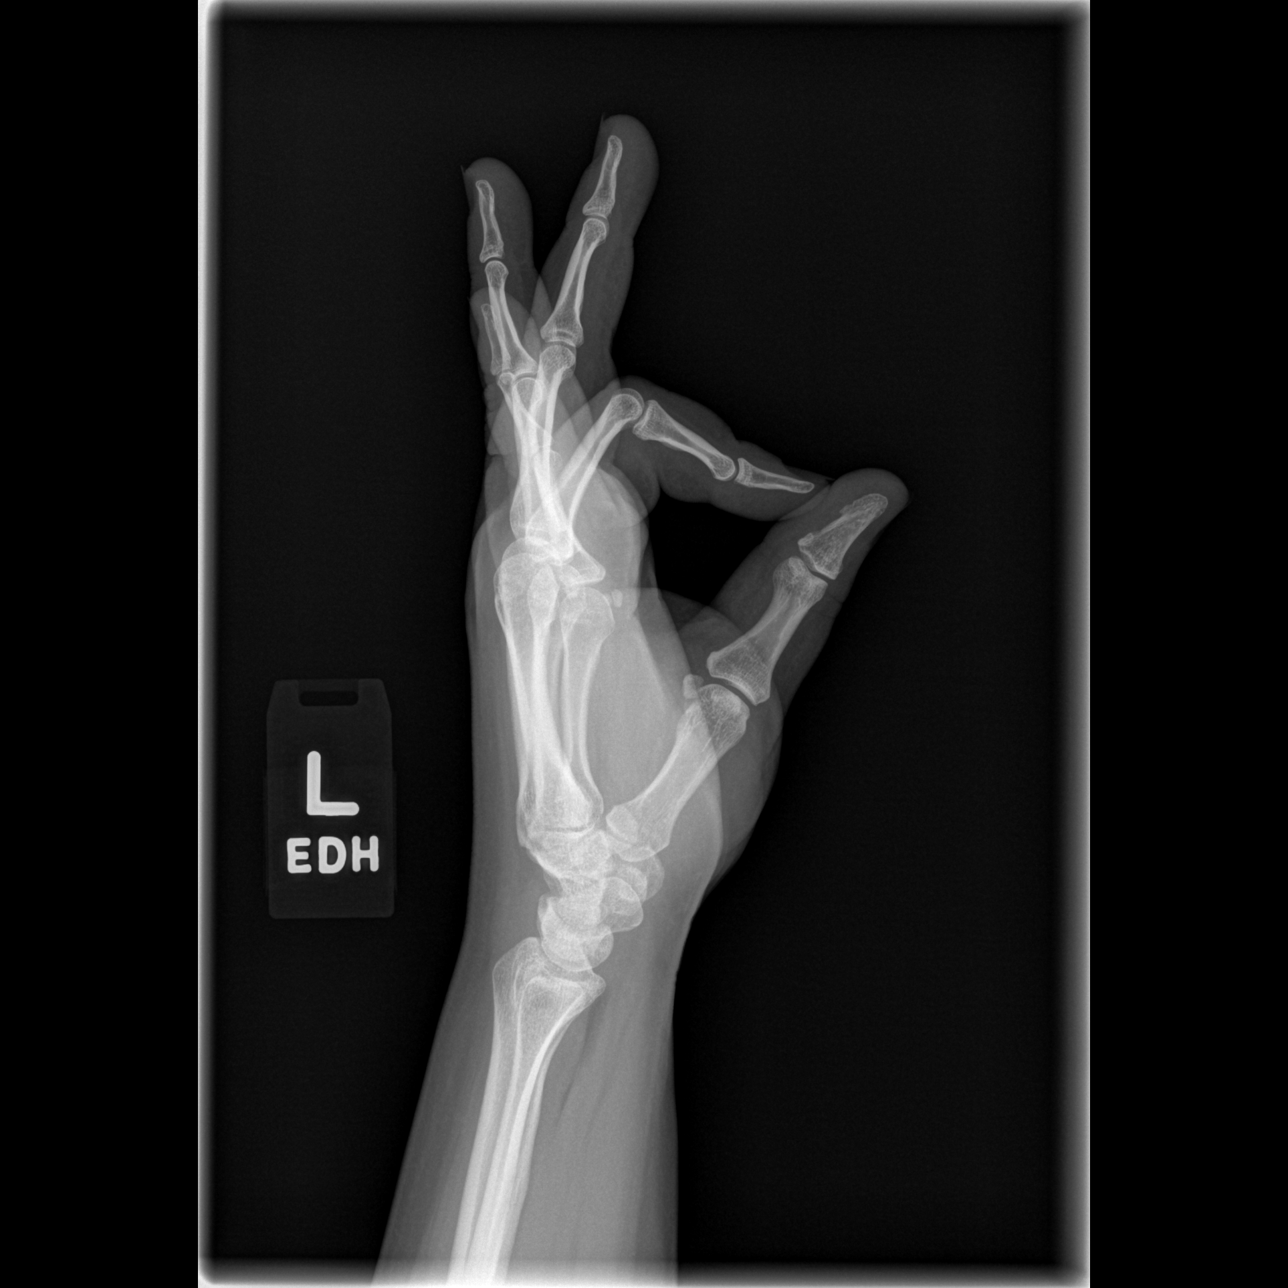

[3 of 3 positions shown; findings below may reference images not displayed]

FINDINGS: There is no evidence of fracture or dislocation. There is no
evidence of arthropathy or other focal bone abnormality. Soft
tissues are unremarkable.
IMPRESSION: Negative.

## 2022-10-14 ENCOUNTER — Telehealth: Payer: Self-pay | Admitting: Physician Assistant

## 2022-10-14 DIAGNOSIS — J020 Streptococcal pharyngitis: Secondary | ICD-10-CM

## 2022-10-14 DIAGNOSIS — H109 Unspecified conjunctivitis: Secondary | ICD-10-CM

## 2022-10-14 DIAGNOSIS — B9689 Other specified bacterial agents as the cause of diseases classified elsewhere: Secondary | ICD-10-CM

## 2022-10-14 MED ORDER — AZITHROMYCIN 250 MG PO TABS: ORAL_TABLET | ORAL | 0 refills | Status: AC

## 2022-10-14 MED ORDER — POLYMYXIN B-TRIMETHOPRIM 10000-0.1 UNIT/ML-% OP SOLN: 1.0000 [drp] | OPHTHALMIC | 0 refills | Status: DC

## 2022-10-14 NOTE — Patient Instructions (Signed)
Rachel Cohen, thank you for joining Margaretann Loveless, PA-C for today's virtual visit.  While this provider is not your primary care provider (PCP), if your PCP is located in our provider database this encounter information will be shared with them immediately following your visit.   A [facility] MyChart account gives you access to today's visit and all your visits, tests, and labs performed at [facility] " click here if you don't have a [facility] MyChart account or go to [email or web address]  Consent: (Patient) Rachel Cohen provided verbal consent for this virtual visit at the beginning of the encounter.  Current Medications:  Current Outpatient Medications:    azithromycin (ZITHROMAX) 250 MG tablet, Take 2 tablets on day 1, then 1 tablet daily on days 2 through 5, Disp: 6 tablet, Rfl: 0   trimethoprim-polymyxin b (POLYTRIM) ophthalmic solution, Place 1 drop into both eyes every 4 (four) hours., Disp: 10 mL, Rfl: 0   albuterol (VENTOLIN HFA) 108 (90 Base) MCG/ACT inhaler, Inhale into the lungs., Disp: , Rfl:    ALPRAZolam (XANAX) 0.25 MG tablet, TK 1 T PO HS PRN, Disp: , Rfl:    beclomethasone (QVAR REDIHALER) 40 MCG/ACT inhaler, Inhale into the lungs., Disp: , Rfl:    BuPROPion HBr 348 MG TB24, Take by mouth., Disp: , Rfl:    doxepin (SINEQUAN) 10 MG capsule, Take by mouth., Disp: , Rfl:    hydrOXYzine (ATARAX/VISTARIL) 25 MG tablet, TK 2 TS PO HS, Disp: , Rfl:    levonorgestrel-ethinyl estradiol (INTROVALE) 0.15-0.03 MG tablet, Take by mouth., Disp: , Rfl:    pantoprazole (PROTONIX) 40 MG tablet, Take by mouth., Disp: , Rfl:    sertraline (ZOLOFT) 100 MG tablet, Take 100 mg by mouth daily., Disp: , Rfl:    Medications ordered in this encounter:  Meds ordered this encounter  Medications   azithromycin (ZITHROMAX) 250 MG tablet    Sig: Take 2 tablets on day 1, then 1 tablet daily on days 2 through 5    Dispense:  6 tablet    Refill:  0    Order Specific  Question:   Supervising Provider    Answer:   Merrilee Jansky [[id number]]   trimethoprim-polymyxin b (POLYTRIM) ophthalmic solution    Sig: Place 1 drop into both eyes every 4 (four) hours.    Dispense:  10 mL    Refill:  0    Order Specific Question:   Supervising Provider    Answer:   Merrilee Jansky [[id number]]     *If you need refills on other medications prior to your next appointment, please contact your pharmacy*  Follow-Up: Call back or seek an in-person evaluation if the symptoms worsen or if the condition fails to improve as anticipated.  [facility] [phone]  Other Instructions  Strep Throat, Adult Strep throat is an infection in the throat that is caused by bacteria. It is common during the cold months of the year. It mostly affects children who are 97-30 years old. However, people of all ages can get it at any time of the year. This infection spreads from person to person (is contagious) through coughing, sneezing, or having close contact. Your health care provider may use other names to describe the infection. When strep throat affects the tonsils, it is called tonsillitis. When it affects the back of the throat, it is called pharyngitis. What are the causes? This condition is caused by the Streptococcus pyogenes bacteria. What increases the  risk? You are more likely to develop this condition if: You care for school-age children, or are around school-age children. Children are more likely to get strep throat and may spread it to others. You spend time in crowded places where the infection can spread easily. You have close contact with someone who has strep throat. What are the signs or symptoms? Symptoms of this condition include: Fever or chills. Redness, swelling, or pain in the tonsils or throat. Pain or difficulty when swallowing. White or yellow spots on the tonsils or throat. Tender glands in the neck and under the jaw. Bad smelling  breath. Red rash all over the body. This is rare. How is this diagnosed? This condition is diagnosed by tests that check for the presence and the amount of bacteria that cause strep throat. They are: Rapid strep test. Your throat is swabbed and checked for the presence of bacteria. Results are usually ready in minutes. Throat culture test. Your throat is swabbed. The sample is placed in a cup that allows infections to grow. Results are usually ready in 1 or 2 days. How is this treated? This condition may be treated with: Medicines that kill germs (antibiotics). Medicines that relieve pain or fever. These include: Ibuprofen or acetaminophen. Aspirin, only for people who are over the age of 70. Throat lozenges. Throat sprays. Follow these instructions at home: Medicines  Take over-the-counter and prescription medicines only as told by your health care provider. Take your antibiotic medicine as told by your health care provider. Do not stop taking the antibiotic even if you start to feel better. Eating and drinking  If you have trouble swallowing, try eating soft foods until your sore throat feels better. Drink enough fluid to keep your urine pale yellow. To help relieve pain, you may have: Warm fluids, such as soup and tea. Cold fluids, such as frozen desserts or popsicles. General instructions Gargle with a salt-water mixture 3-4 times a day or as needed. To make a salt-water mixture, completely dissolve -1 tsp (3-6 g) of salt in 1 cup (237 mL) of warm water. Get plenty of rest. Stay home from work or school until you have been taking antibiotics for 24 hours. Do not use any products that contain nicotine or tobacco. These products include cigarettes, chewing tobacco, and vaping devices, such as e-cigarettes. If you need help quitting, ask your health care provider. It is up to you to get your test results. Ask your health care provider, or the department that is doing the test, when  your results will be ready. Keep all follow-up visits. This is important. How is this prevented?  Do not share food, drinking cups, or personal items that could cause the infection to spread to other people. Wash your hands often with soap and water for at least 20 seconds. If soap and water are not available, use hand sanitizer. Make sure that all people in your house wash their hands well. Have family members tested if they have a sore throat or fever. They may need an antibiotic if they have strep throat. Contact a health care provider if: You have swelling in your neck that keeps getting bigger. You develop a rash, cough, or earache. You cough up a thick mucus that is green, yellow-brown, or bloody. You have pain or discomfort that does not get better with medicine. Your symptoms seem to be getting worse. You have a fever. Get help right away if: You have new symptoms, such as vomiting, severe headache,  stiff or painful neck, chest pain, or shortness of breath. You have severe throat pain, drooling, or changes in your voice. You have swelling of the neck, or the skin on the neck becomes red and tender. You have signs of dehydration, such as tiredness (fatigue), dry mouth, and decreased urination. You become increasingly sleepy, or you cannot wake up completely. Your joints become red or painful. These symptoms may represent a serious problem that is an emergency. Do not wait to see if the symptoms will go away. Get medical help right away. Call your local emergency services (911 in the [place] Do not drive yourself to the hospital. Summary Strep throat is an infection in the throat that is caused by the Streptococcus pyogenes bacteria. This infection is spread from person to person (is contagious) through coughing, sneezing, or having close contact. Take your medicines, including antibiotics, as told by your health care provider. Do not stop taking the antibiotic even if you start to feel  better. To prevent the spread of germs, wash your hands well with soap and water. Have others do the same. Do not share food, drinking cups, or personal items. Get help right away if you have new symptoms, such as vomiting, severe headache, stiff or painful neck, chest pain, or shortness of breath. This information is not intended to replace advice given to you by your health care provider. Make sure you discuss any questions you have with your health care provider. Document Revised: 02/01/2021 Document Reviewed: 02/01/2021 Elsevier Patient Education  2023 Elsevier Inc.    If you have been instructed to have an in-person evaluation today at a local Urgent Care facility, please use the link below. It will take you to a list of all of our available [facility], including address, phone number and hours of operation. Please do not delay care.  [facility]  If you or a family member do not have a primary care provider, use the link below to schedule a visit and establish care. When you choose a [facility] primary care physician or advanced practice provider, you gain a long-term partner in health. Find a Primary Care Provider  Learn more about [facility]'s in-office and virtual care options: [facility] - Get Care Now

## 2022-10-14 NOTE — Progress Notes (Signed)
Virtual Visit Consent   Rachel Cohen, you are scheduled for a virtual visit with a [facility] provider today. Just as with appointments in the office, your consent must be obtained to participate. Your consent will be active for this visit and any virtual visit you may have with one of our providers in the next 365 days. If you have a MyChart account, a copy of this consent can be sent to you electronically.  As this is a virtual visit, video technology does not allow for your provider to perform a traditional examination. This may limit your provider's ability to fully assess your condition. If your provider identifies any concerns that need to be evaluated in person or the need to arrange testing (such as labs, EKG, etc.), we will make arrangements to do so. Although advances in technology are sophisticated, we cannot ensure that it will always work on either your end or our end. If the connection with a video visit is poor, the visit may have to be switched to a telephone visit. With either a video or telephone visit, we are not always able to ensure that we have a secure connection.  By engaging in this virtual visit, you consent to the provision of healthcare and authorize for your insurance to be billed (if applicable) for the services provided during this visit. Depending on your insurance coverage, you may receive a charge related to this service.  I need to obtain your verbal consent now. Are you willing to proceed with your visit today? Rachel Cohen has provided verbal consent on 10/14/2022 for a virtual visit (video or telephone). Margaretann Loveless, PA-C  Date: 10/14/2022 2:02 PM  Virtual Visit via Video Note   I, Margaretann Loveless, connected with  Rachel Cohen  ([id number], [birth year]) on 10/14/22 at  2:00 PM EST by a video-enabled telemedicine application and verified that I am speaking with the correct person using two identifiers.  Location: Patient: Virtual Visit Location  Patient: Home Provider: Virtual Visit Location Provider: Home Office   I discussed the limitations of evaluation and management by telemedicine and the availability of in person appointments. The patient expressed understanding and agreed to proceed.    History of Present Illness: Rachel Cohen is a 46 y.o. who identifies as a female who was assigned female at birth, and is being seen today for URI symptoms with sore throat.  HPI: Sore Throat  This is a new problem. The current episode started in the past 7 days (Thursday, 10/12/22). The problem has been gradually worsening. There has been no fever. The pain is moderate. Associated symptoms include trouble swallowing. Pertinent negatives include no ear discharge or shortness of breath. Associated symptoms comments: Eye redness, eye discharge . Exposure to: Psychologist, prison and probation services so many exposures. The treatment provided no relief.      Problems:  Patient Active Problem List   Diagnosis Date Noted   Left wrist pain 08/25/2019   Metatarsal stress fracture of left foot 03/08/2017    Allergies:  Allergies  Allergen Reactions   Compazine [Prochlorperazine] Nausea Only   Sulfa Antibiotics Nausea Only   Penicillins Rash   Medications:  Current Outpatient Medications:    azithromycin (ZITHROMAX) 250 MG tablet, Take 2 tablets on day 1, then 1 tablet daily on days 2 through 5, Disp: 6 tablet, Rfl: 0   trimethoprim-polymyxin b (POLYTRIM) ophthalmic solution, Place 1 drop into both eyes every 4 (four) hours., Disp: 10 mL, Rfl: 0   albuterol (  VENTOLIN HFA) 108 (90 Base) MCG/ACT inhaler, Inhale into the lungs., Disp: , Rfl:    ALPRAZolam (XANAX) 0.25 MG tablet, TK 1 T PO HS PRN, Disp: , Rfl:    beclomethasone (QVAR REDIHALER) 40 MCG/ACT inhaler, Inhale into the lungs., Disp: , Rfl:    BuPROPion HBr 348 MG TB24, Take by mouth., Disp: , Rfl:    doxepin (SINEQUAN) 10 MG capsule, Take by mouth., Disp: , Rfl:    hydrOXYzine (ATARAX/VISTARIL) 25 MG  tablet, TK 2 TS PO HS, Disp: , Rfl:    levonorgestrel-ethinyl estradiol (INTROVALE) 0.15-0.03 MG tablet, Take by mouth., Disp: , Rfl:    pantoprazole (PROTONIX) 40 MG tablet, Take by mouth., Disp: , Rfl:    sertraline (ZOLOFT) 100 MG tablet, Take 100 mg by mouth daily., Disp: , Rfl:   Observations/Objective: Patient is well-developed, well-nourished in no acute distress.  Resting comfortably at home.  Head is normocephalic, atraumatic.  No labored breathing.  Speech is clear and coherent with logical content.  Patient is alert and oriented at baseline.    Assessment and Plan: 1. Strep pharyngitis - azithromycin (ZITHROMAX) 250 MG tablet; Take 2 tablets on day 1, then 1 tablet daily on days 2 through 5  Dispense: 6 tablet; Refill: 0  2. Bacterial conjunctivitis of both eyes - trimethoprim-polymyxin b (POLYTRIM) ophthalmic solution; Place 1 drop into both eyes every 4 (four) hours.  Dispense: 10 mL; Refill: 0  - Suspect strep throat - Azithromycin prescribed - Tylenol and Ibuprofen alternating every 4 hours - Salt water gargles - Chloraseptic spray - Liquid and soft food diet - Push fluids - New toothbrush in 3 days  - Suspect bacterial conjunctivitis - Polytrim prescribed - Warm compresses - Good hand hygiene  - Seek in person evaluation if not improving or if symptoms worsen   Follow Up Instructions: I discussed the assessment and treatment plan with the patient. The patient was provided an opportunity to ask questions and all were answered. The patient agreed with the plan and demonstrated an understanding of the instructions.  A copy of instructions were sent to the patient via MyChart unless otherwise noted below.    The patient was advised to call back or seek an in-person evaluation if the symptoms worsen or if the condition fails to improve as anticipated.  Time:  I spent 10 minutes with the patient via telehealth technology discussing the above problems/concerns.     Margaretann Loveless, PA-C

## 2023-04-16 ENCOUNTER — Ambulatory Visit: Payer: Self-pay | Admitting: Family Medicine

## 2023-04-19 ENCOUNTER — Ambulatory Visit (HOSPITAL_BASED_OUTPATIENT_CLINIC_OR_DEPARTMENT_OTHER)
Admission: RE | Admit: 2023-04-19 | Discharge: 2023-04-19 | Disposition: A | Discharge status: Home / Self Care | Payer: BLUE CROSS/BLUE SHIELD | Source: Ambulatory Visit | Attending: Family Medicine | Admitting: Family Medicine

## 2023-04-19 ENCOUNTER — Ambulatory Visit (INDEPENDENT_AMBULATORY_CARE_PROVIDER_SITE_OTHER): Payer: BLUE CROSS/BLUE SHIELD | Admitting: Family Medicine

## 2023-04-19 ENCOUNTER — Ambulatory Visit: Payer: Self-pay | Admitting: Family Medicine

## 2023-04-19 ENCOUNTER — Encounter: Payer: Self-pay | Admitting: Family Medicine

## 2023-04-19 VITALS — BP 128/80 | Ht 64.0 in | Wt 250.0 lb

## 2023-04-19 DIAGNOSIS — M79671 Pain in right foot: Secondary | ICD-10-CM

## 2023-04-19 DIAGNOSIS — M25561 Pain in right knee: Secondary | ICD-10-CM | POA: Diagnosis present

## 2023-04-19 NOTE — Progress Notes (Signed)
PCP: Cheral Bay, MD  Subjective:   HPI: Patient is a 47 y.o. female here for right knee, foot pain.  Patient reports about 1 week ago she tripped over a dog gate. She fell directly onto her right knee and bent right foot dorsally also. Hurts to bend right knee now and noticed foot is still achy with bruising on the dorsal side medially. Tried ibuprofen.   Past Medical History:  Diagnosis Date   Depression    IBS (irritable bowel syndrome)     Current Outpatient Medications on File Prior to Visit  Medication Sig Dispense Refill   albuterol (VENTOLIN HFA) 108 (90 Base) MCG/ACT inhaler Inhale into the lungs.     ALPRAZolam (XANAX) 0.25 MG tablet TK 1 T PO HS PRN     BuPROPion HBr 348 MG TB24 Take by mouth.     doxepin (SINEQUAN) 10 MG capsule Take by mouth.     hydrOXYzine (ATARAX/VISTARIL) 25 MG tablet TK 2 TS PO HS     levonorgestrel-ethinyl estradiol (INTROVALE) 0.15-0.03 MG tablet Take by mouth.     pantoprazole (PROTONIX) 40 MG tablet Take by mouth.     trimethoprim-polymyxin b (POLYTRIM) ophthalmic solution Place 1 drop into both eyes every 4 (four) hours. 10 mL 0   No current facility-administered medications on file prior to visit.    Past Surgical History:  Procedure Laterality Date   CHOLECYSTECTOMY      Allergies  Allergen Reactions   Compazine [Prochlorperazine] Nausea Only   Sulfa Antibiotics Nausea Only   Penicillins Rash    BP 128/80 (BP Location: Left Arm, Patient Position: Sitting)   Ht 5\' 4"  (1.626 m)   Wt 250 lb (113.4 kg)   BMI 42.91 kg/m       No data to display              No data to display              Objective:  Physical Exam:  Gen: NAD, comfortable in exam room  Right knee: No gross deformity, ecchymoses, swelling. TTP inferior patella.  No other tenderness. FROM with normal strength. Negative ant/post drawers. Negative valgus/varus testing. Negative lachman.  Negative mcmurrays, apleys.  NV intact  distally.  Right foot/ankle: Mod swelling dorsal foot with bruising.  No other deformity. FROM ankle without pain. TTP dorsal foot over 1st through 3rd metatarsals.  No other tenderness including malleoli, navicular. Negative ant drawer and negative talar tilt.   Thompsons test negative. NV intact distally.   Assessment & Plan:  1. Right foot injury - radiographs negative for fracture.  Limited bedside ultrasound without cortical irregularity of medial 3 metatarsals.  2/2 contusion, sprain.  Cam walker.  Icing, elevation.  Aleve or ibuprofen.  F/u in 3 weeks if not improving.  2. Right knee injury - radiographs negative.  2/2 contusion.  Icing, aleve or ibuprofen.  F/u in 3 weeks if not improving.

## 2023-04-19 NOTE — Patient Instructions (Signed)
Your x-rays look good. Wear the boot when up and walking around as needed. Take this off to sleep, ice 15 minutes at a time 3-4 times a day, bathe. Do motion exercises of ankle a few times a day too. Ibuprofen or aleve as needed. Follow up with me in 3 weeks if not improving as expected.

## 2024-08-08 ENCOUNTER — Telehealth

## 2024-08-08 DIAGNOSIS — B9689 Other specified bacterial agents as the cause of diseases classified elsewhere: Secondary | ICD-10-CM

## 2024-08-08 DIAGNOSIS — J019 Acute sinusitis, unspecified: Secondary | ICD-10-CM

## 2024-08-09 MED ORDER — DOXYCYCLINE HYCLATE 100 MG PO TABS: 100.0000 mg | ORAL_TABLET | Freq: Two times a day (BID) | ORAL | 0 refills | Status: AC

## 2024-08-09 NOTE — Progress Notes (Signed)
